# Patient Record
Sex: Female | Born: 1970 | Race: White | Hispanic: No | Marital: Married | Smoking: Current every day smoker
Health system: Southern US, Community
[De-identification: ages and names within clinical notes are randomized; demographics above are authoritative.]

## PROBLEM LIST (undated history)

## (undated) HISTORY — PX: CHOLECYSTECTOMY: SHX55

## (undated) HISTORY — PX: ANKLE FRACTURE SURGERY: SHX122

## (undated) HISTORY — PX: TUBAL LIGATION: SHX77

---

## 2018-12-21 ENCOUNTER — Ambulatory Visit (INDEPENDENT_AMBULATORY_CARE_PROVIDER_SITE_OTHER): Payer: BC Managed Care – PPO

## 2018-12-21 ENCOUNTER — Ambulatory Visit
Admission: EM | Admit: 2018-12-21 | Discharge: 2018-12-21 | Disposition: A | Discharge status: Home / Self Care | Payer: BC Managed Care – PPO

## 2018-12-21 ENCOUNTER — Other Ambulatory Visit: Payer: Self-pay

## 2018-12-21 DIAGNOSIS — Y9319 Activity, other involving water and watercraft: Secondary | ICD-10-CM

## 2018-12-21 DIAGNOSIS — S20211A Contusion of right front wall of thorax, initial encounter: Secondary | ICD-10-CM | POA: Diagnosis not present

## 2018-12-21 MED ORDER — TRAMADOL HCL 50 MG PO TABS: 50.0000 mg | ORAL_TABLET | Freq: Three times a day (TID) | ORAL | 0 refills | Status: AC | PRN

## 2018-12-21 MED ORDER — LIDOCAINE 5 % EX PTCH: 1.0000 | MEDICATED_PATCH | CUTANEOUS | 0 refills | Status: AC
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
## 2018-12-21 NOTE — Discharge Instructions (Addendum)
It was very nice seeing you today in clinic. Thank you for entrusting me with your care.  ° °Please utilize the medications that we discussed. Your prescriptions have been called in to your pharmacy.  ° °Make arrangements to follow up with your regular doctor in 1 week for re-evaluation if not improving. If your symptoms/condition worsens, please seek follow up care either here or in the ER. Please remember, our [place] providers are "right here with you" when you need us.  ° °Again, it was my pleasure to take care of you today. Thank you for choosing our clinic. I hope that you start to feel better quickly.  ° °Kaizer Dissinger, MSN, APRN, FNP-C, CEN °Advanced Practice Provider ° [facility] ° °
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
## 2020-01-31 IMAGING — CR RIGHT RIBS AND CHEST - 3+ VIEW
5 series · 5 of 5 positions shown · non-contrast
Comparison: None.

CLINICAL DATA: Acute right rib pain after injury 3 days ago.

EXAM:
RIGHT RIBS AND CHEST - 3+ VIEW

[rib pa (1 of 2)]
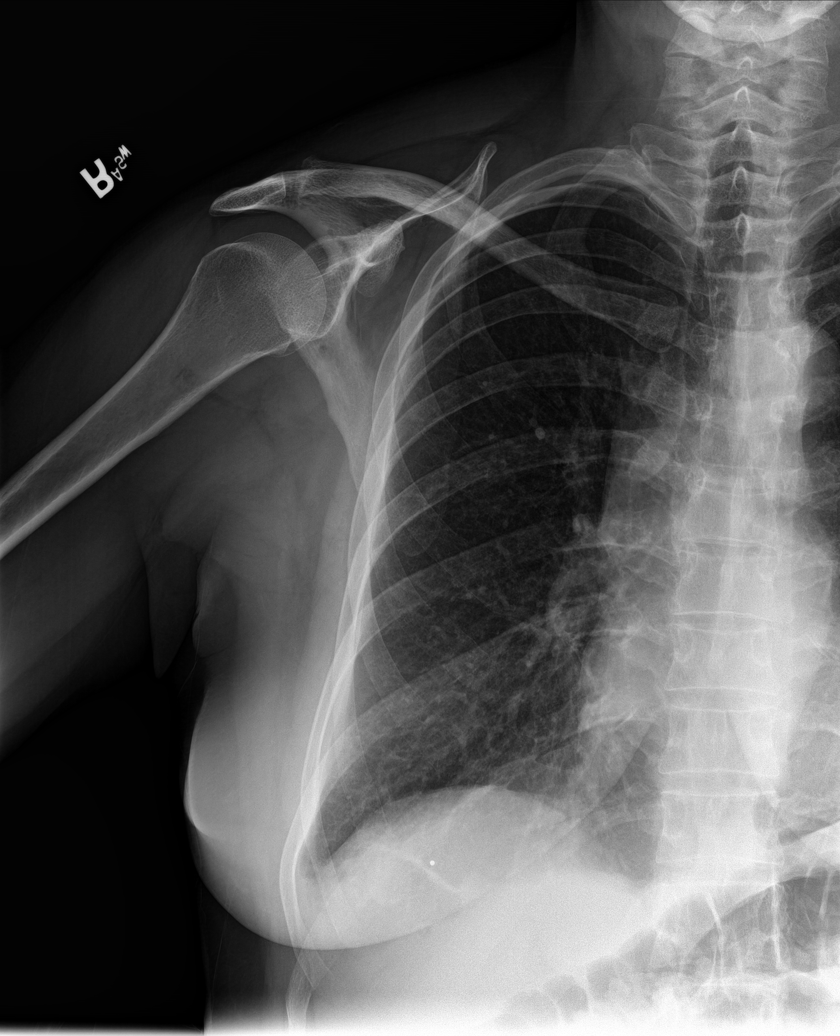

[rib pa (2 of 2)]
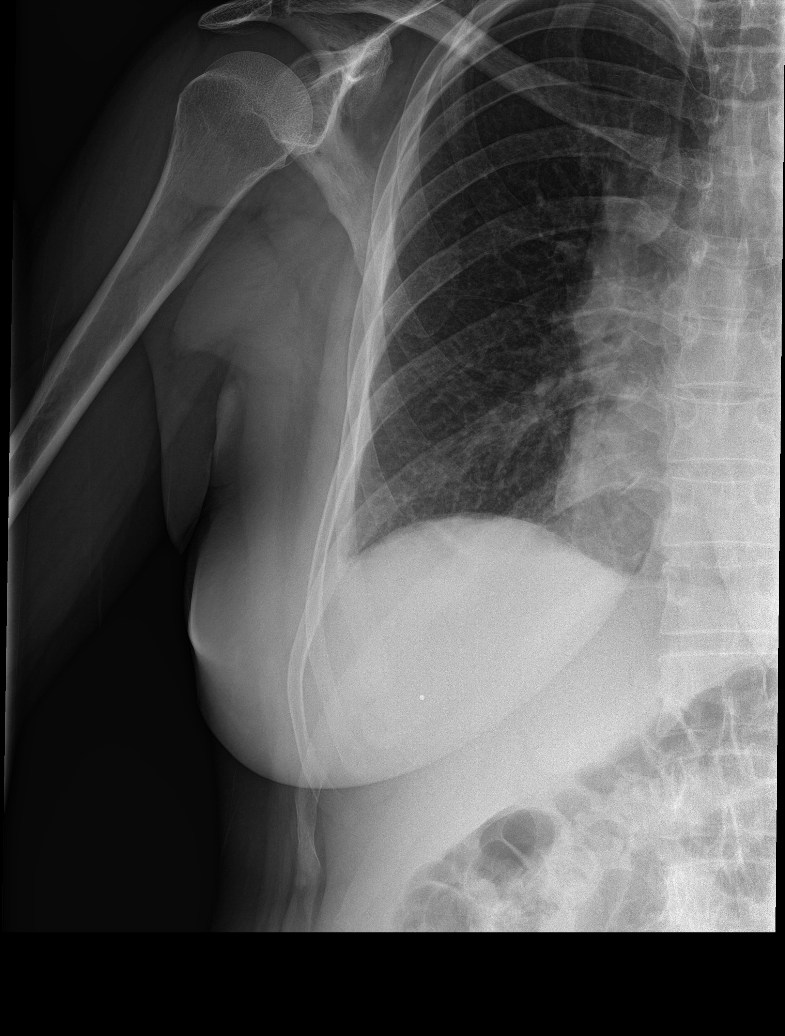

[rib obl (1 of 2)]
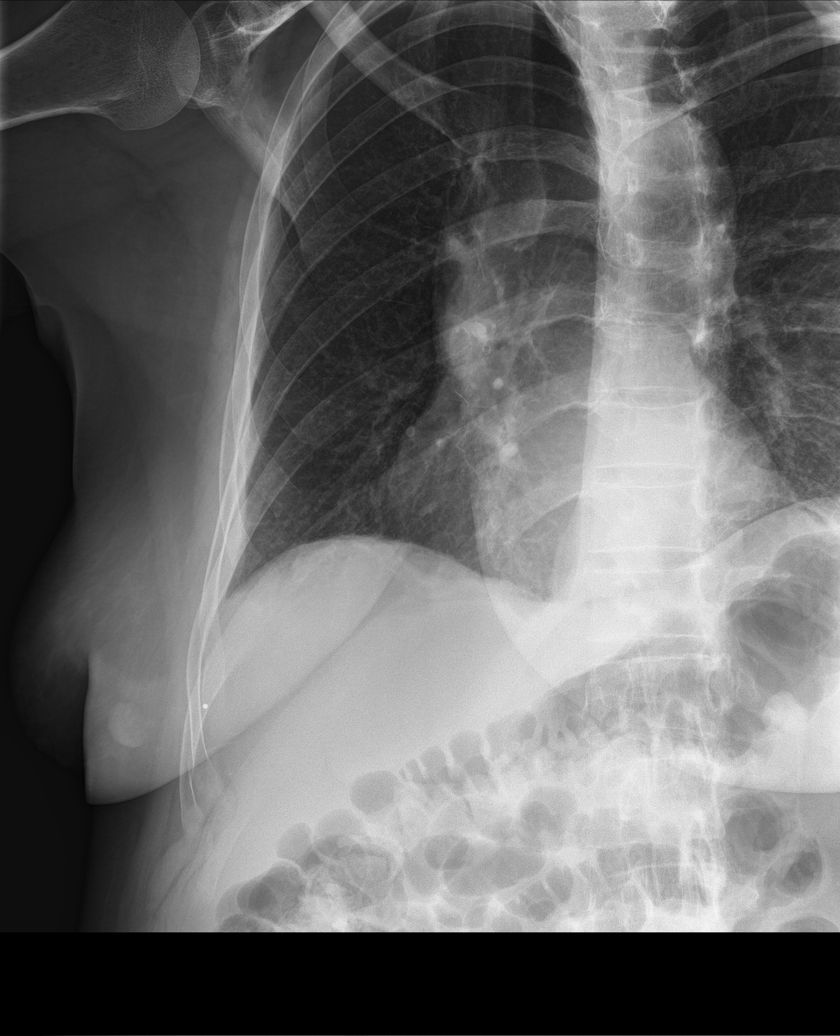

[chest pa]
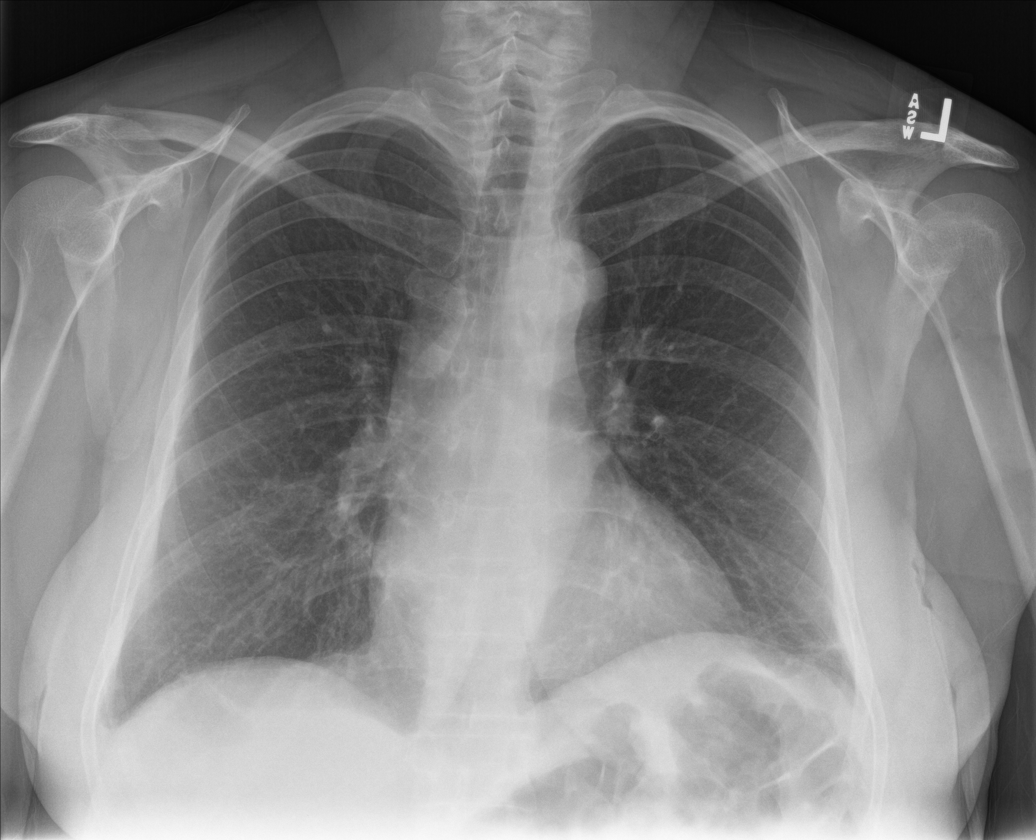

[rib obl (2 of 2)]
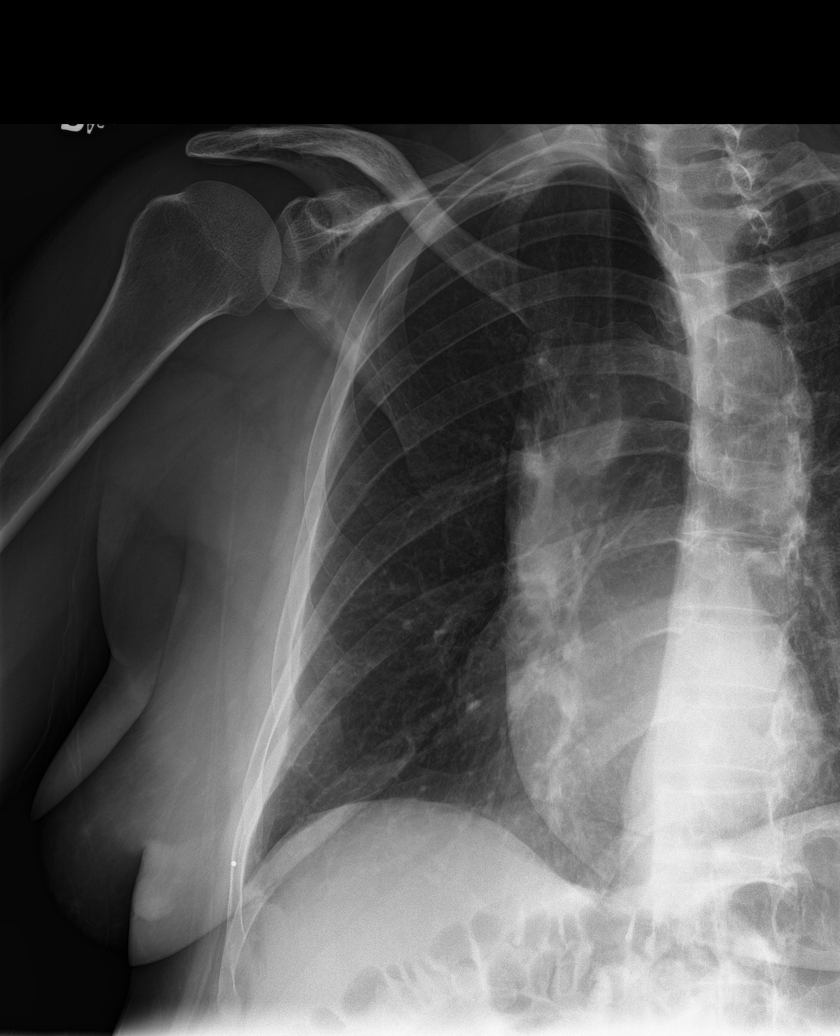

[5 of 5 positions shown; findings below may reference images not displayed]

FINDINGS: No fracture or other bone lesions are seen involving the ribs. There
is no evidence of pneumothorax or pleural effusion. Both lungs are
clear. Heart size and mediastinal contours are within normal limits.
IMPRESSION: Negative.
